# Patient Record
Sex: Female | Born: 1995 | Race: White | Hispanic: No | Marital: Single | Smoking: Former smoker
Health system: Southern US, Community
[De-identification: ages and names within clinical notes are randomized; demographics above are authoritative.]

---

## 2015-06-13 ENCOUNTER — Emergency Department (HOSPITAL_COMMUNITY): Payer: BC Managed Care – PPO

## 2015-06-13 ENCOUNTER — Encounter (HOSPITAL_COMMUNITY): Payer: Self-pay | Admitting: Emergency Medicine

## 2015-06-13 ENCOUNTER — Emergency Department (HOSPITAL_COMMUNITY)
Admission: EM | Admit: 2015-06-13 | Discharge: 2015-06-14 | Disposition: A | Discharge status: Home / Self Care | Payer: BC Managed Care – PPO | Attending: Emergency Medicine | Admitting: Emergency Medicine

## 2015-06-13 DIAGNOSIS — F17200 Nicotine dependence, unspecified, uncomplicated: Secondary | ICD-10-CM | POA: Insufficient documentation

## 2015-06-13 DIAGNOSIS — Y9289 Other specified places as the place of occurrence of the external cause: Secondary | ICD-10-CM | POA: Diagnosis not present

## 2015-06-13 DIAGNOSIS — Y998 Other external cause status: Secondary | ICD-10-CM | POA: Diagnosis not present

## 2015-06-13 DIAGNOSIS — W208XXA Other cause of strike by thrown, projected or falling object, initial encounter: Secondary | ICD-10-CM | POA: Diagnosis not present

## 2015-06-13 DIAGNOSIS — Z793 Long term (current) use of hormonal contraceptives: Secondary | ICD-10-CM | POA: Insufficient documentation

## 2015-06-13 DIAGNOSIS — S90812A Abrasion, left foot, initial encounter: Secondary | ICD-10-CM | POA: Insufficient documentation

## 2015-06-13 DIAGNOSIS — S92912A Unspecified fracture of left toe(s), initial encounter for closed fracture: Secondary | ICD-10-CM

## 2015-06-13 DIAGNOSIS — S99922A Unspecified injury of left foot, initial encounter: Secondary | ICD-10-CM | POA: Diagnosis present

## 2015-06-13 DIAGNOSIS — Y9389 Activity, other specified: Secondary | ICD-10-CM | POA: Diagnosis not present

## 2015-06-13 DIAGNOSIS — S92515A Nondisplaced fracture of proximal phalanx of left lesser toe(s), initial encounter for closed fracture: Secondary | ICD-10-CM | POA: Diagnosis not present

## 2015-06-13 NOTE — ED Provider Notes (Signed)
History  By signing my name below, I, Karle PlumberJennifer Tensley, attest that this documentation has been prepared under the direction and in the presence of Felicie Mornavid Maximilliano Kersh, FNP. Electronically Signed: Karle PlumberJennifer Tensley, ED Scribe. 06/13/2015. 11:46 PM.  Chief Complaint  Patient presents with  . Foot Injury   The history is provided by the patient and medical records. No language interpreter was used.    HPI Comments:  Crystal Dickson is a 20 y.o. female who presents to the Emergency Department complaining of a left third toe injury at the base that occurred about one hour ago. She reports associated swelling, bruising and an abrasion. She has not taken anything for pain but ice was applied in triage. Moving the toe increases the pain. She denies alleviating factors. She denies numbness, tingling or weakness of the left third toe, left foot or LLE. Pt states her tetanus vaccination is UTD. She reports taking birth control daily.  History reviewed. No pertinent past medical history. History reviewed. No pertinent past surgical history. No family history on file. Social History  Substance Use Topics  . Smoking status: Current Every Day Smoker  . Smokeless tobacco: None  . Alcohol Use: Yes   OB History    No data available     Review of Systems  Musculoskeletal: Positive for arthralgias.  All other systems reviewed and are negative.   Allergies  Review of patient's allergies indicates not on file.  Home Medications   Prior to Admission medications   Not on File   Triage Vitals: BP 126/71 mmHg  Pulse 83  Temp(Src) 98.2 F (36.8 C) (Oral)  Resp 20  Ht 5\' 11"  (1.803 m)  Wt 165 lb (74.844 kg)  BMI 23.02 kg/m2  SpO2 100% Physical Exam  Constitutional: She is oriented to person, place, and time. She appears well-developed and well-nourished.  HENT:  Head: Normocephalic and atraumatic.  Eyes: EOM are normal.  Neck: Normal range of motion.  Cardiovascular: Normal rate.   Pulmonary/Chest:  Effort normal.  Musculoskeletal: She exhibits tenderness.  Pain with superficial abrasion overlying the MTP joint of third toe of left foot  Neurological: She is alert and oriented to person, place, and time.  Skin: Skin is warm and dry.  Psychiatric: She has a normal mood and affect. Her behavior is normal.  Nursing note and vitals reviewed.   ED Course  Procedures (including critical care time) DIAGNOSTIC STUDIES: Oxygen Saturation is 100% on RA, normal by my interpretation.   COORDINATION OF CARE: 11:18 PM- Will order X-Ray of left foot. Pt verbalizes understanding and agrees to plan.  Medications - No data to display  Labs Review Labs Reviewed - No data to display  Imaging Review Dg Foot Complete Left  06/13/2015  CLINICAL DATA:  Dropped lap top on left foot today. EXAM: LEFT FOOT - COMPLETE 3+ VIEW COMPARISON:  None. FINDINGS: There is a nondisplaced well aligned fracture of the third proximal phalangeal base and proximal shaft, apparently sparing the articular surface. No dislocation. No radiopaque foreign body. No other acute findings are evident. IMPRESSION: Nondisplaced well aligned fracture at the base of the third proximal phalanx. Electronically Signed   By: Ellery Plunkaniel R Mitchell M.D.   On: 06/13/2015 23:43   I have personally reviewed and evaluated these images and lab results as part of my medical decision-making.   EKG Interpretation None     Radiology results reviewed and shared with patient. MDM   Final diagnoses:  None  Proximal phalanx fracture 3rd left toe.  Care instructions provided and return precautions discussed. Follow-up with orthopedics.    I personally performed the services described in this documentation, which was scribed in my presence. The recorded information has been reviewed and is accurate.     Felicie Morn, NP 06/14/15 1610  Azalia Bilis, MD 06/14/15 [phone]

## 2015-06-13 NOTE — ED Notes (Signed)
Pt st's she dropped her lap top on top of her left foot.  C/o pain at base of left 3'rd toe.

## 2015-06-14 NOTE — Discharge Instructions (Signed)
Toe Fracture °A toe fracture is a break in one of the toe bones (phalanges). °CAUSES °This condition may be caused by: °· Dropping a heavy object on your toe. °· Stubbing your toe. °· Overusing your toe or doing repetitive exercise. °· Twisting or stretching your toe out of place. °RISK FACTORS °This condition is more likely to develop in people who: °· Play contact sports. °· Have a bone disease. °· Have a low calcium level. °SYMPTOMS °The main symptoms of this condition are swelling and pain in the toe. The pain may get worse with standing or walking. Other symptoms include: °· Bruising. °· Stiffness. °· Numbness. °· A change in the way the toe looks. °· Broken bones that poke through the skin. °· Blood beneath the toenail. °DIAGNOSIS °This condition is diagnosed with a physical exam. You may also have X-rays. °TREATMENT  °Treatment for this condition depends on the type of fracture and its severity. Treatment may involve: °· Taping the broken toe to a toe that is next to it (buddy taping). This is the most common treatment for fractures in which the bone has not moved out of place (nondisplaced fracture). °· Wearing a shoe that has a wide, rigid sole to protect the toe and to limit its movement. °· Wearing a walking cast. °· Having a procedure to move the toe back into place. °· Surgery. This may be needed: °¨ If there are many pieces of broken bone that are out of place (displaced). °¨ If the toe joint breaks. °¨ If the bone breaks through the skin. °· Physical therapy. This is done to help regain movement and strength in the toe. °You may need follow-up X-rays to make sure that the bone is healing well and staying in position. °HOME CARE INSTRUCTIONS °If You Have a Cast: °· Do not stick anything inside the cast to scratch your skin. Doing that increases your risk of infection. °· Check the skin around the cast every day. Report any concerns to your health care provider. You may put lotion on dry skin around the  edges of the cast. Do not apply lotion to the skin underneath the cast. °· Do not put pressure on any part of the cast until it is fully hardened. This may take several hours. °· Keep the cast clean and dry. °Bathing °· Do not take baths, swim, or use a hot tub until your health care provider approves. Ask your health care provider if you can take showers. You may only be allowed to take sponge baths for bathing. °· If your health care provider approves bathing and showering, cover the cast or bandage (dressing) with a watertight plastic bag to protect it from water. Do not let the cast or dressing get wet. °Managing Pain, Stiffness, and Swelling °· If you do not have a cast, apply ice to the injured area, if directed. °¨ Put ice in a plastic bag. °¨ Place a towel between your skin and the bag. °¨ Leave the ice on for 20 minutes, 2-3 times per day. °· Move your toes often to avoid stiffness and to lessen swelling. °· Raise (elevate) the injured area above the level of your heart while you are sitting or lying down. °Driving °· Do not drive or operate heavy machinery while taking pain medicine. °· Do not drive while wearing a cast on a foot that you use for driving. °Activity °· Return to your normal activities as directed by your health care provider. Ask your health care   provider what activities are safe for you. °· Perform exercises daily as directed by your health care provider or physical therapist. °Safety °· Do not use the injured limb to support your body weight until your health care provider says that you can. Use crutches or other assistive devices as directed by your health care provider. °General Instructions °· If your toe was treated with buddy taping, follow your health care provider's instructions for changing the gauze and tape. Change it more often: °¨ The gauze and tape get wet. If this happens, dry the space between the toes. °¨ The gauze and tape are too tight and cause your toe to become pale  or numb. °· Wear a protective shoe as directed by your health care provider. If you were not given a protective shoe, wear sturdy, supportive shoes. Your shoes should not pinch your toes and should not fit tightly against your toes. °· Do not use any tobacco products, including cigarettes, chewing tobacco, or e-cigarettes. Tobacco can delay bone healing. If you need help quitting, ask your health care provider. °· Take medicines only as directed by your health care provider. °· Keep all follow-up visits as directed by your health care provider. This is important. °SEEK MEDICAL CARE IF: °· You have a fever. °· Your pain medicine is not helping. °· Your toe is cold. °· Your toe is numb. °· You still have pain after one week of rest and treatment. °· You still have pain after your health care provider has said that you can start walking again. °· You have pain, tingling, or numbness in your foot that is not going away. °SEEK IMMEDIATE MEDICAL CARE IF: °· You have severe pain. °· You have redness or inflammation in your toe that is getting worse. °· You have pain or numbness in your toe that is getting worse. °· Your toe turns blue. °  °This information is not intended to replace advice given to you by your health care provider. Make sure you discuss any questions you have with your health care provider. °  °Document Released: 01/23/2000 Document Revised: 10/16/2014 Document Reviewed: 11/21/2013 °Elsevier Interactive Patient Education ©2016 [employer] ° °

## 2015-11-19 ENCOUNTER — Emergency Department (HOSPITAL_COMMUNITY): Payer: BC Managed Care – PPO

## 2015-11-19 ENCOUNTER — Emergency Department (HOSPITAL_COMMUNITY)
Admission: EM | Admit: 2015-11-19 | Discharge: 2015-11-19 | Disposition: A | Discharge status: Home / Self Care | Payer: BC Managed Care – PPO | Attending: Emergency Medicine | Admitting: Emergency Medicine

## 2015-11-19 ENCOUNTER — Encounter (HOSPITAL_COMMUNITY): Payer: Self-pay | Admitting: Emergency Medicine

## 2015-11-19 DIAGNOSIS — Y9389 Activity, other specified: Secondary | ICD-10-CM | POA: Diagnosis not present

## 2015-11-19 DIAGNOSIS — Y929 Unspecified place or not applicable: Secondary | ICD-10-CM | POA: Diagnosis not present

## 2015-11-19 DIAGNOSIS — S022XXA Fracture of nasal bones, initial encounter for closed fracture: Secondary | ICD-10-CM | POA: Diagnosis not present

## 2015-11-19 DIAGNOSIS — Z87891 Personal history of nicotine dependence: Secondary | ICD-10-CM | POA: Insufficient documentation

## 2015-11-19 DIAGNOSIS — Y999 Unspecified external cause status: Secondary | ICD-10-CM | POA: Diagnosis not present

## 2015-11-19 DIAGNOSIS — W500XXA Accidental hit or strike by another person, initial encounter: Secondary | ICD-10-CM | POA: Diagnosis not present

## 2015-11-19 DIAGNOSIS — S0993XA Unspecified injury of face, initial encounter: Secondary | ICD-10-CM | POA: Diagnosis present

## 2015-11-19 MED ORDER — IBUPROFEN 800 MG PO TABS: 800.0000 mg | ORAL_TABLET | Freq: Three times a day (TID) | ORAL | 0 refills | Status: DC | PRN

## 2015-11-19 MED ORDER — HYDROCODONE-ACETAMINOPHEN 5-325 MG PO TABS: 1.0000 | ORAL_TABLET | Freq: Four times a day (QID) | ORAL | 0 refills | Status: DC | PRN

## 2015-11-19 MED ORDER — HYDROCODONE-ACETAMINOPHEN 5-325 MG PO TABS
2.0000 | ORAL_TABLET | Freq: Once | ORAL | Status: AC
  Administered 2015-11-19: 2 via ORAL
  Filled 2015-11-19: qty 2

## 2015-11-19 MED ORDER — IBUPROFEN 800 MG PO TABS
800.0000 mg | ORAL_TABLET | Freq: Once | ORAL | Status: AC
  Administered 2015-11-19: 800 mg via ORAL
  Filled 2015-11-19: qty 1

## 2015-11-19 MED ORDER — ONDANSETRON 4 MG PO TBDP
4.0000 mg | ORAL_TABLET | Freq: Once | ORAL | Status: AC
  Administered 2015-11-19: 4 mg via ORAL
  Filled 2015-11-19: qty 1
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
## 2016-04-06 ENCOUNTER — Encounter (HOSPITAL_COMMUNITY): Payer: Self-pay | Admitting: Emergency Medicine

## 2016-04-06 ENCOUNTER — Ambulatory Visit (HOSPITAL_COMMUNITY)
Admission: EM | Admit: 2016-04-06 | Discharge: 2016-04-06 | Disposition: A | Discharge status: Home / Self Care | Payer: BC Managed Care – PPO | Attending: Family Medicine | Admitting: Family Medicine

## 2016-04-06 DIAGNOSIS — J029 Acute pharyngitis, unspecified: Secondary | ICD-10-CM | POA: Insufficient documentation

## 2016-04-06 DIAGNOSIS — Z87891 Personal history of nicotine dependence: Secondary | ICD-10-CM | POA: Insufficient documentation

## 2016-04-06 LAB — POCT RAPID STREP A: STREPTOCOCCUS, GROUP A SCREEN (DIRECT): NEGATIVE

## 2016-04-06 MED ORDER — CHLORHEXIDINE GLUCONATE 0.12 % MT SOLN: 15.0000 mL | Freq: Two times a day (BID) | OROMUCOSAL | 0 refills | Status: AC

## 2016-04-06 MED ORDER — AMOXICILLIN 875 MG PO TABS: 875.0000 mg | ORAL_TABLET | Freq: Two times a day (BID) | ORAL | 0 refills | Status: DC

## 2016-04-06 NOTE — Discharge Instructions (Signed)
Your initial strep test was negative. Because of the redness and duration of your symptoms, I prescribed for you some medicine for comfort as well as for possible strep throat while we are waiting for your final culture results to return in 2-3 days.

## 2016-04-06 NOTE — ED Provider Notes (Signed)
[facility]    CSN: [id number] Arrival date & time: 04/06/16  1154     History   Chief Complaint Chief Complaint  Patient presents with  . Sore Throat    HPI Crystal Dickson is a 21 y.o. female.   This 21 year old female presents to the [facility] urgent care center with a complaint of a sore throat. She's had the study sore throat for about 4 days. She's had no fever but she has had an occasional cough. She's also had some congestion in her sinuses.  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      History reviewed. No pertinent past medical history.  There are no active problems to display for this patient.   History reviewed. No pertinent surgical history.  OB History    No data available       Home Medications    Prior to Admission medications   Medication Sig Start Date End Date Taking? Authorizing Provider  Norgestim-Eth Estrad Triphasic (TRI-SPRINTEC PO) Take by mouth.   Yes Historical Provider, MD  amoxicillin (AMOXIL) 875 MG tablet Take 1 tablet (875 mg total) by mouth 2 (two) times daily. 04/06/16   Elvina Sidle, MD  chlorhexidine (PERIDEX) 0.12 % solution Use as directed 15 mLs in the mouth or throat 2 (two) times daily. 04/06/16   Elvina Sidle, MD    Family History Family History  Problem Relation Age of Onset  . Hypertension Mother     Social History Social History  Substance Use Topics  . Smoking status: Former Games developer  . Smokeless tobacco: Never Used  . Alcohol use No     Allergies   Cherry   Review of Systems Review of Systems  Constitutional: Negative.   HENT: Positive for congestion and sore throat.   Respiratory: Positive for cough.   Cardiovascular: Negative.   Gastrointestinal: Negative.   Neurological: Negative.      Physical Exam Triage Vital Signs ED Triage Vitals  Enc Vitals Group     BP 04/06/16 1246 124/80     Pulse Rate 04/06/16 1246 77   Resp 04/06/16 1246 16     Temp 04/06/16 1246 97.8 F (36.6 C)     Temp Source 04/06/16 1246 Oral     SpO2 04/06/16 1246 98 %     Weight --      Height --      Head Circumference --      Peak Flow --      Pain Score 04/06/16 1250 7     Pain Loc --      Pain Edu? --      Excl. in GC? --    No data found.   Updated Vital Signs BP 124/80 (BP Location: Left Arm)   Pulse 77   Temp 97.8 F (36.6 C) (Oral)   Resp 16   SpO2 98%    Physical Exam  Constitutional: She is oriented to person, place, and time. She appears well-developed and well-nourished.  HENT:  Head: Normocephalic.  Right Ear: External ear normal.  Left Ear: External ear normal.  Mouth/Throat: Oropharynx is clear and moist.  Eyes: Conjunctivae and EOM are normal. Pupils are equal, round, and reactive to light.  Neck: Normal range of motion. Neck supple.  Pulmonary/Chest: Effort normal.  Musculoskeletal: Normal range of motion.  Neurological: She is alert and oriented to person, place, and time.  Skin: Skin is warm and dry.  Psychiatric: She has a normal mood and affect.  Nursing note and vitals reviewed.    UC Treatments / Results  Labs (all labs ordered are listed, but only abnormal results are displayed) Labs Reviewed  POCT RAPID STREP A    EKG  EKG Interpretation None       Radiology No results found.  Procedures Procedures (including critical care time)  Medications Ordered in UC Medications - No data to display   Initial Impression / Assessment and Plan / UC Course  I have reviewed the triage vital signs and the nursing notes.  Pertinent labs & imaging results that were available during my care of the patient were reviewed by me and considered in my medical decision making (see chart for details).     Final Clinical Impressions(s) / UC Diagnoses   Final diagnoses:  Sore throat    New Prescriptions New Prescriptions   AMOXICILLIN (AMOXIL) 875 MG TABLET    Take 1 tablet (875  mg total) by mouth 2 (two) times daily.   CHLORHEXIDINE (PERIDEX) 0.12 % SOLUTION    Use as directed 15 mLs in the mouth or throat 2 (two) times daily.     Elvina SidleKurt Renda Pohlman, MD 04/06/16 1329

## 2016-04-06 NOTE — ED Triage Notes (Signed)
[facility] a [facility] x 5 days. The patient denied any fever at home.

## 2016-04-09 LAB — CULTURE, GROUP A STREP (THRC)

## 2016-12-26 ENCOUNTER — Ambulatory Visit (HOSPITAL_COMMUNITY)
Admission: EM | Admit: 2016-12-26 | Discharge: 2016-12-26 | Disposition: A | Discharge status: Home / Self Care | Payer: BC Managed Care – PPO | Attending: Family Medicine | Admitting: Family Medicine

## 2016-12-26 ENCOUNTER — Other Ambulatory Visit: Payer: Self-pay

## 2016-12-26 ENCOUNTER — Encounter (HOSPITAL_COMMUNITY): Payer: Self-pay

## 2016-12-26 DIAGNOSIS — L089 Local infection of the skin and subcutaneous tissue, unspecified: Secondary | ICD-10-CM | POA: Diagnosis not present

## 2016-12-26 DIAGNOSIS — T148XXA Other injury of unspecified body region, initial encounter: Secondary | ICD-10-CM

## 2016-12-26 MED ORDER — CEPHALEXIN 500 MG PO CAPS: 500.0000 mg | ORAL_CAPSULE | Freq: Four times a day (QID) | ORAL | 0 refills | Status: AC

## 2016-12-26 MED ORDER — MUPIROCIN CALCIUM 2 % EX CREA: 1.0000 "application " | TOPICAL_CREAM | Freq: Two times a day (BID) | CUTANEOUS | 0 refills | Status: AC

## 2016-12-26 NOTE — Discharge Instructions (Signed)
Take the antibiotic as directed. Apply the anti-bacterial cream as directed. Return for worsening. Keep the area clean and dry. For the next 3-4 days keep it wrapped so it does not rub against clothing or exposed to other germs.

## 2016-12-26 NOTE — ED Provider Notes (Signed)
[facility]    CSN: [id number] Arrival date & time: 12/26/16  1206     History   Chief Complaint Chief Complaint  Patient presents with  . Wound Infection    possible tattoo infection     HPI Crystal Dickson is a 21 y.o. female.   21 year old female received a tattoo 7 days ago. She noticed 2 days later that she developed the oozing, crusty honey-colored fluid from the lines of the tattoo located to the left inner arm. Complains of mild tenderness and concern for infection. Denies systemic symptoms.      History reviewed. No pertinent past medical history.  There are no active problems to display for this patient.   History reviewed. No pertinent surgical history.  OB History    No data available       Home Medications    Prior to Admission medications   Medication Sig Start Date End Date Taking? Authorizing Provider  cephALEXin (KEFLEX) 500 MG capsule Take 1 capsule (500 mg total) 4 (four) times daily by mouth. 12/26/16   Garett Tetzloff, Onalee Huaavid, NP  chlorhexidine (PERIDEX) 0.12 % solution Use as directed 15 mLs in the mouth or throat 2 (two) times daily. 04/06/16   Elvina SidleLauenstein, Kurt, MD  mupirocin cream (BACTROBAN) 2 % Apply 1 application 2 (two) times daily topically. Cream or ointment, cheaper one 12/26/16   Hayden RasmussenMabe, Ramello Cordial, NP  Norgestim-Eth Charlott HollerEstrad Triphasic (TRI-SPRINTEC PO) Take by mouth.    [provider]    Family History Family History  Problem Relation Age of Onset  . Hypertension Mother     Social History Social History   Tobacco Use  . Smoking status: Former Games developermoker  . Smokeless tobacco: Never Used  Substance Use Topics  . Alcohol use: No  . Drug use: No     Allergies   Cherry   Review of Systems Review of Systems  Constitutional: Negative.   Musculoskeletal: Negative.   Skin: Positive for wound.       As per history of present illness  Neurological: Negative.      Physical Exam Triage Vital Signs ED Triage Vitals  Enc  Vitals Group     BP 12/26/16 1236 131/80     Pulse Rate 12/26/16 1236 72     Resp 12/26/16 1236 16     Temp 12/26/16 1236 98.5 F (36.9 C)     Temp Source 12/26/16 1236 Oral     SpO2 12/26/16 1236 99 %     Weight --      Height --      Head Circumference --      Peak Flow --      Pain Score 12/26/16 1237 5     Pain Loc --      Pain Edu? --      Excl. in GC? --    No data found.  Updated Vital Signs BP 131/80 (BP Location: Right Arm)   Pulse 72   Temp 98.5 F (36.9 C) (Oral)   Resp 16   LMP 12/13/2016 (Approximate)   SpO2 99%   Visual Acuity Right Eye Distance:   Left Eye Distance:   Bilateral Distance:    Right Eye Near:   Left Eye Near:    Bilateral Near:     Physical Exam  Constitutional: She is oriented to person, place, and time. She appears well-developed and well-nourished. No distress.  Eyes: EOM are normal.  Neck: Neck supple.  Cardiovascular: Normal rate.  Pulmonary/Chest: Effort  normal. No respiratory distress.  Musculoskeletal: She exhibits no edema.  Neurological: She is alert and oriented to person, place, and time. She exhibits normal muscle tone.  Skin: Skin is warm and dry.  Tattoo located to the left upper inner aspect of the arm there is a honey colored discharge located along the lines of the tattoo. See photograph. minimal erythema along these areas. No definitive cellulitis. No swelling. Positive for local tenderness.  Psychiatric: She has a normal mood and affect.  Nursing note and vitals reviewed.    UC Treatments / Results  Labs (all labs ordered are listed, but only abnormal results are displayed) Labs Reviewed - No data to display  EKG  EKG Interpretation None       Radiology No results found.  Procedures Procedures (including critical care time)  Medications Ordered in UC Medications - No data to display       Initial Impression / Assessment and Plan / UC Course  I have reviewed the triage vital signs and the  nursing notes.  Pertinent labs & imaging results that were available during my care of the patient were reviewed by me and considered in my medical decision making (see chart for details).    Take the antibiotic as directed. Apply the anti-bacterial cream as directed. Return for worsening. Keep the area clean and dry. For the next 3-4 days keep it wrapped so it does not rub against clothing or exposed to other germs.     Final Clinical Impressions(s) / UC Diagnoses   Final diagnoses:  Wound infection    ED Discharge Orders        Ordered    cephALEXin (KEFLEX) 500 MG capsule  4 times daily     12/26/16 1301    mupirocin cream (BACTROBAN) 2 %  2 times daily     12/26/16 1301       Controlled Substance Prescriptions [place] Controlled Substance Registry consulted? Not Applicable   Hayden RasmussenMabe, Kamauri Kathol, NP 12/26/16 1303

## 2016-12-26 NOTE — ED Notes (Signed)
Patient moved off track board prior to order completion

## 2016-12-26 NOTE — ED Triage Notes (Addendum)
[facility] x5 days ago on left arm
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
